# Patient Record
Sex: Female | Born: 2000 | Hispanic: Refuse to answer | Marital: Single | Smoking: Never smoker
Health system: Southern US, Community
[De-identification: ages and names within clinical notes are randomized; demographics above are authoritative.]

## PROBLEM LIST (undated history)

## (undated) HISTORY — PX: NO PAST SURGERIES: SHX2092

---
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
## 2021-11-04 DIAGNOSIS — Z3480 Encounter for supervision of other normal pregnancy, unspecified trimester: Secondary | ICD-10-CM | POA: Insufficient documentation

## 2021-11-04 LAB — HEPATITIS C ANTIBODY: HCV Ab: NEGATIVE

## 2021-11-04 LAB — OB RESULTS CONSOLE HIV ANTIBODY (ROUTINE TESTING): HIV: NONREACTIVE

## 2021-11-04 LAB — OB RESULTS CONSOLE RUBELLA ANTIBODY, IGM: Rubella: IMMUNE

## 2021-11-04 LAB — OB RESULTS CONSOLE HEPATITIS B SURFACE ANTIGEN: Hepatitis B Surface Ag: NEGATIVE

## 2021-11-04 LAB — OB RESULTS CONSOLE RPR: RPR: NONREACTIVE

## 2022-05-23 ENCOUNTER — Encounter (HOSPITAL_COMMUNITY): Payer: Self-pay

## 2022-05-23 ENCOUNTER — Other Ambulatory Visit: Payer: Self-pay

## 2022-05-23 ENCOUNTER — Inpatient Hospital Stay (HOSPITAL_COMMUNITY)
Admission: RE | Admit: 2022-05-23 | Discharge: 2022-05-23 | Disposition: A | Discharge status: Home / Self Care | Payer: Medicaid Other | Attending: Obstetrics and Gynecology | Admitting: Obstetrics and Gynecology

## 2022-05-23 DIAGNOSIS — O4703 False labor before 37 completed weeks of gestation, third trimester: Secondary | ICD-10-CM | POA: Diagnosis not present

## 2022-05-23 DIAGNOSIS — Z3A36 36 weeks gestation of pregnancy: Secondary | ICD-10-CM

## 2022-05-23 DIAGNOSIS — Z0371 Encounter for suspected problem with amniotic cavity and membrane ruled out: Secondary | ICD-10-CM | POA: Diagnosis present

## 2022-05-23 LAB — POCT FERN TEST
POCT Fern Test: NEGATIVE
POCT Fern Test: NEGATIVE

## 2022-05-23 NOTE — MAU Note (Signed)
Cathy Weeks is a 21 y.o. at [redacted]w[redacted]d here in MAU reporting: contractions started around 0400 and they are about every 10 minutes. No bleeding. LOF since 0400, fluid was milky and now is clear, states it is a lot. +FM.  Onset of complaint: today  Pain score: 2/10  Vitals:   05/23/22 0730  BP: 121/76  Pulse: 69  Resp: 16  Temp: 97.7 F (36.5 C)  SpO2: 98%     FHT:137  Lab orders placed from triage: none

## 2022-05-23 NOTE — MAU Provider Note (Signed)
History     [id number]  Arrival date and time: 05/23/22 9485    Chief Complaint  Patient presents with   Contractions   Rupture of Membranes     HPI Cathy Weeks is a 21 y.o. at 50w3dwho presents for vaginal discharge. Reports watery discharge  since 4 am. Discharge is clear & watery with no odor. Has some contractions about every 10 minutes. Denies vaginal symptoms or vaginal bleeding. Reports positive fetal movement.   OB History     Gravida  2   Para  1   Term  1   Preterm      AB      Living  1      SAB      IAB      Ectopic      Multiple      Live Births              Past Medical History:  Diagnosis Date   Postpartum depression 2021   Severe preeclampsia 2021    Past Surgical History:  Procedure Laterality Date   NO PAST SURGERIES      History reviewed. No pertinent family history.  Not on File  No current facility-administered medications on file prior to encounter.   Current Outpatient Medications on File Prior to Encounter  Medication Sig Dispense Refill   Accu-Chek FastClix Lancets MISC Use to monitor blood glucose 4 time(s) daily     Blood Glucose Monitoring Suppl (ACCU-CHEK GUIDE) w/Device KIT by Does not apply route.     glucose blood (ACCU-CHEK GUIDE) test strip Use to monitor blood glucose 4 time(s) daily     Prenatal Vit-Fe Fumarate-FA (PRENATAL VITAMIN) 27-0.8 MG TABS Take 1 tablet by mouth daily.     Aspirin (VAZALORE) 81 MG CAPS Take by mouth.       ROS Pertinent positives and negative per HPI, all others reviewed and negative  Physical Exam   BP 122/75 (BP Location: Right Arm)   Pulse 66   Temp 97.7 F (36.5 C) (Oral)   Resp 16   Ht 5' 2"  (1.575 m)   Wt 58 kg   SpO2 98% Comment: room air  BMI 23.37 kg/m   Patient Vitals for the past 24 hrs:  BP Temp Temp src Pulse Resp SpO2 Height Weight  05/23/22 0756 122/75 -- -- 66 -- -- -- --  05/23/22 0730 121/76 97.7 F (36.5 C) Oral 69 16 98 % -- --   05/23/22 0725 -- -- -- -- -- -- 5' 2"  (1.575 m) 58 kg    Physical Exam Vitals and nursing note reviewed. Exam conducted with a chaperone present.  Constitutional:      General: She is not in acute distress.    Appearance: Normal appearance.  HENT:     Head: Normocephalic and atraumatic.  Eyes:     General: No scleral icterus.    Conjunctiva/sclera: Conjunctivae normal.  Pulmonary:     Effort: Pulmonary effort is normal. No respiratory distress.  Genitourinary:    Comments: Pelvic: NEFG, scant thin white discharge. No pooling of fluid. No vaginal bleeding.  Neurological:     Mental Status: She is alert.  Psychiatric:        Mood and Affect: Mood normal.        Behavior: Behavior normal.      FHT Baseline 130, moderate variability, 15x15 accels, no decels Toco: irregular Cat: 1  Labs Results for orders placed or performed during the  hospital encounter of 05/23/22 (from the past 24 hour(s))  Fern Test     Status: None   Collection Time: 05/23/22  8:00 AM  Result Value Ref Range   POCT Fern Test Negative = intact amniotic membranes   POCT fern test     Status: Normal   Collection Time: 05/23/22  8:46 AM  Result Value Ref Range   POCT Fern Test Negative = intact amniotic membranes     Imaging No results found.  MAU Course  Procedures Lab Orders         Fern Test         POCT fern test    No orders of the defined types were placed in this encounter.  Imaging Orders  No imaging studies ordered today    MDM Sterile speculum exam performed & fern collected. No pooling of fluid. Fern slide evaluated & is negative.   Reviewed prenatal records with [place] through care everywhere. Reviewed visits from 11/04/21, 04/24/2022, & 05/07/2022. Patient declined gtt. Plans on transferring to [facility] for care.  Assessment and Plan   1. Encounter for suspected PROM, with rupture of membranes not found   2. [redacted] weeks gestation of pregnancy    -Reviewed labor precautions &  reasons to return to [place], NP 05/23/22 8:52 AM

## 2022-05-27 ENCOUNTER — Encounter: Payer: Self-pay | Admitting: Certified Nurse Midwife

## 2022-05-29 ENCOUNTER — Ambulatory Visit (INDEPENDENT_AMBULATORY_CARE_PROVIDER_SITE_OTHER): Payer: Medicaid Other | Admitting: Certified Nurse Midwife

## 2022-05-29 ENCOUNTER — Other Ambulatory Visit (HOSPITAL_COMMUNITY)
Admission: RE | Admit: 2022-05-29 | Discharge: 2022-05-29 | Disposition: A | Discharge status: Home / Self Care | Payer: Medicaid Other | Source: Ambulatory Visit | Attending: Certified Nurse Midwife | Admitting: Certified Nurse Midwife

## 2022-05-29 VITALS — BP 123/78 | HR 65 | Wt 134.0 lb

## 2022-05-29 DIAGNOSIS — Z8659 Personal history of other mental and behavioral disorders: Secondary | ICD-10-CM

## 2022-05-29 DIAGNOSIS — Z3A37 37 weeks gestation of pregnancy: Secondary | ICD-10-CM

## 2022-05-29 DIAGNOSIS — Z8759 Personal history of other complications of pregnancy, childbirth and the puerperium: Secondary | ICD-10-CM

## 2022-05-29 DIAGNOSIS — Z3480 Encounter for supervision of other normal pregnancy, unspecified trimester: Secondary | ICD-10-CM

## 2022-05-29 DIAGNOSIS — Z3483 Encounter for supervision of other normal pregnancy, third trimester: Secondary | ICD-10-CM | POA: Diagnosis not present

## 2022-05-29 LAB — OB RESULTS CONSOLE GBS: GBS: POSITIVE

## 2022-05-29 NOTE — Progress Notes (Signed)
Subjective:   Cathy Weeks is a 21 y.o. G2P1001 at 38w2dby early ultrasound being seen today for her first obstetrical visit.  Her obstetrical history is significant for pre-eclampsia and PPD . Patient does intend to breast feed. Pregnancy history fully reviewed.  Patient reports no complaints.  HISTORY: OB History  Gravida Para Term Preterm AB Living  _0 0 0 1  SAB IAB Ectopic Multiple Live Births  0 0 0 0 0    # Outcome Date GA Lbr Len/2nd Weight Sex Delivery Anes PTL Lv  2 Current           1 Term 2021 414w0d F Vag-Spont        Complications: Severe preeclampsia   Past Medical History:  Diagnosis Date   Postpartum depression 2021   Severe preeclampsia 2021   Past Surgical History:  Procedure Laterality Date   NO PAST SURGERIES     History reviewed. No pertinent family history. Social History   Tobacco Use   Smoking status: Never   Smokeless tobacco: Never  Vaping Use   Vaping Use: Never used  Substance Use Topics   Alcohol use: Never   Drug use: Not Currently    Types: Marijuana   Not on File Current Outpatient Medications on File Prior to Visit  Medication Sig Dispense Refill   Accu-Chek FastClix Lancets MISC Use to monitor blood glucose 4 time(s) daily     Aspirin (VAZALORE) 81 MG CAPS Take by mouth.     Blood Glucose Monitoring Suppl (ACCU-CHEK GUIDE) w/Device KIT by Does not apply route.     glucose blood (ACCU-CHEK GUIDE) test strip Use to monitor blood glucose 4 time(s) daily     Prenatal Vit-Fe Fumarate-FA (PRENATAL VITAMIN) 27-0.8 MG TABS Take 1 tablet by mouth daily.     No current facility-administered medications on file prior to visit.   Indications for ASA therapy (per uptodate) One of the following: Previous pregnancy with preeclampsia, especially early onset and with an adverse outcome Yes Multifetal gestation No Chronic hypertension No Type 1 or 2 diabetes mellitus No Chronic kidney disease No Autoimmune disease  (antiphospholipid syndrome, systemic lupus erythematosus) No  Two or more of the following: Nulliparity No Obesity (body mass index >30 kg/m2) No Family history of preeclampsia in mother or sister No Age ?35 years No Sociodemographic characteristics (African American race, low socioeconomic level) Yes Personal risk factors (eg, previous pregnancy with low birth weight or small for gestational age infant, previous adverse pregnancy outcome [eg, stillbirth], interval >10 years between pregnancies) No  Indications for early 1 hour GTT (per uptodate)  BMI >25 (>23 in Asian women) AND one of the following  Gestational diabetes mellitus in a previous pregnancy No Glycated hemoglobin ?5.7 percent (39 mmol/mol), impaired glucose tolerance, or impaired fasting glucose on previous testing No First-degree relative with diabetes No High-risk race/ethnicity (eg, African American, Latino, Native American, [place], Pacific Islander) Yes History of cardiovascular disease No Hypertension or on therapy for hypertension No High-density lipoprotein cholesterol level <35 mg/dL (0.90 mmol/L) and/or a triglyceride level >250 mg/dL (2.82 mmol/L) No Polycystic ovary syndrome No Physical inactivity No Other clinical condition associated with insulin resistance (eg, severe obesity, acanthosis nigricans) No Previous birth of an infant weighing ?4000 g No Previous stillbirth of unknown cause No   Exam   Vitals:   05/29/22 0936  BP: 123/78  Pulse: 65  Weight: 134 lb (60.8 kg)   Fetal Heart Rate (  bpm): 138  Uterus:     Pelvic Exam: Perineum: deferred   Vulva:    Vagina:     Cervix:    Adnexa:    Bony Pelvis:   System: General: well-developed, well-nourished female in no acute distress   Breast:     Skin: normal coloration and turgor, no rashes   Neurologic: oriented, normal, negative, normal mood   Extremities: normal strength, tone, and muscle mass, ROM of all joints is normal   HEENT  extraocular movement intact and sclera clear, anicteric   Mouth/Teeth mucous membranes moist, pharynx normal without lesions and dental hygiene good   Neck    Cardiovascular: regular rate    Respiratory:  no respiratory distress   Abdomen: soft, non-tender; no masses     Assessment:   Pregnancy: G2P1001 Patient Active Problem List   Diagnosis Date Noted   Supervision of other normal pregnancy, antepartum 05/29/2022   History of severe pre-eclampsia 05/29/2022   History of postpartum depression 05/29/2022   Prenatal care, subsequent pregnancy, unspecified trimester 11/04/2021     Plan:  1. Prenatal care, subsequent pregnancy, unspecified trimester - Culture, beta strep (group b only) - Cervicovaginal ancillary only( [place])  2. Supervision of other normal pregnancy, antepartum  3. History of severe pre-eclampsia - declines bASA - baseline labs normal - normotensive  4. History of postpartum depression - recommend 1-2 week pp mood check  5. [redacted] weeks gestation of pregnancy - discussed birth preferences - she was informed we do not allow [place] birth d/t infection risk; delayed cord clamping and keeping her placenta were most important to her and we can honor these; she will need to bring a cooler with her - she plans to decline meds and vaccines for baby, informed she can discuss with Peds  Initial labs drawn. Continue prenatal vitamins. Genetic Screening-declined  Ultrasound-normal Problem list reviewed and updated The nature of [place] for [employer] with multiple MDs and other Advanced Practice Providers was explained to patient; also emphasized that fellows, residents, and students are part of our team. Routine obstetric precautions reviewed Return in about 1 week (around 06/05/2022).   Julianne Handler, CNM 11:57 AM 05/29/22

## 2022-05-31 ENCOUNTER — Encounter: Payer: Self-pay | Admitting: Certified Nurse Midwife

## 2022-05-31 LAB — CULTURE, BETA STREP (GROUP B ONLY)
MICRO NUMBER:: 13506413
SPECIMEN QUALITY:: ADEQUATE

## 2022-06-01 ENCOUNTER — Encounter: Payer: Self-pay | Admitting: Certified Nurse Midwife

## 2022-06-01 DIAGNOSIS — O99820 Streptococcus B carrier state complicating pregnancy: Secondary | ICD-10-CM | POA: Insufficient documentation

## 2022-06-01 LAB — CERVICOVAGINAL ANCILLARY ONLY
Chlamydia: NEGATIVE
Comment: NEGATIVE
Comment: NORMAL
Neisseria Gonorrhea: NEGATIVE

## 2022-06-04 ENCOUNTER — Ambulatory Visit (INDEPENDENT_AMBULATORY_CARE_PROVIDER_SITE_OTHER): Payer: Medicaid Other

## 2022-06-04 VITALS — BP 128/70 | HR 66 | Wt 132.0 lb

## 2022-06-04 DIAGNOSIS — Z3480 Encounter for supervision of other normal pregnancy, unspecified trimester: Secondary | ICD-10-CM

## 2022-06-04 DIAGNOSIS — Z8759 Personal history of other complications of pregnancy, childbirth and the puerperium: Secondary | ICD-10-CM

## 2022-06-04 DIAGNOSIS — Z3A38 38 weeks gestation of pregnancy: Secondary | ICD-10-CM | POA: Diagnosis not present

## 2022-06-04 NOTE — Patient Instructions (Signed)

## 2022-06-04 NOTE — Progress Notes (Signed)
   PRENATAL VISIT NOTE  Subjective:  Cathy Weeks is a 21 y.o. G2P1001 at [redacted]w[redacted]d being seen today for ongoing prenatal care.  She is currently monitored for the following issues for this low-risk pregnancy and has Prenatal care, subsequent pregnancy, unspecified trimester; Supervision of other normal pregnancy, antepartum; History of severe pre-eclampsia; History of postpartum depression; and GBS (group B Streptococcus carrier), +RV culture, currently pregnant on their problem list.  Patient reports no complaints.  Contractions: Irritability. Vag. Bleeding: None.  Movement: Present. Denies leaking of fluid.   The following portions of the patient's history were reviewed and updated as appropriate: allergies, current medications, past family history, past medical history, past social history, past surgical history and problem list.   Objective:   Vitals:   06/04/22 0937  BP: 128/70  Pulse: 66  Weight: 132 lb (59.9 kg)    Fetal Status: Fetal Heart Rate (bpm): 130 Fundal Height: 38 cm Movement: Present     General:  Alert, oriented and cooperative. Patient is in no acute distress.  Skin: Skin is warm and dry. No rash noted.   Cardiovascular: Normal heart rate noted  Respiratory: Normal respiratory effort, no problems with respiration noted  Abdomen: Soft, gravid, appropriate for gestational age.  Pain/Pressure: Absent     Pelvic: Cervical exam deferred        Extremities: Normal range of motion.  Edema: None  Mental Status: Normal mood and affect. Normal behavior. Normal judgment and thought content.   Assessment and Plan:  Pregnancy: G2P1001 at [redacted]w[redacted]d 1. Supervision of other normal pregnancy, antepartum -Doing well without concern.  -Anticipatory guidance of next visits reviewed  2. History of severe pre-eclampsia -BP stable  3. [redacted] weeks gestation of pregnancy   Preterm labor symptoms and general obstetric precautions including but not limited to vaginal bleeding,  contractions, leaking of fluid and fetal movement were reviewed in detail with the patient. Please refer to After Visit Summary for other counseling recommendations.   Return in about 1 week (around 06/11/2022) for Return OB visit.  Rolm Bookbinder, CNM

## 2022-06-09 ENCOUNTER — Ambulatory Visit (INDEPENDENT_AMBULATORY_CARE_PROVIDER_SITE_OTHER): Payer: Medicaid Other

## 2022-06-09 VITALS — BP 125/75 | HR 89 | Wt 132.0 lb

## 2022-06-09 DIAGNOSIS — Z3480 Encounter for supervision of other normal pregnancy, unspecified trimester: Secondary | ICD-10-CM

## 2022-06-09 DIAGNOSIS — A491 Streptococcal infection, unspecified site: Secondary | ICD-10-CM

## 2022-06-09 DIAGNOSIS — Z3A38 38 weeks gestation of pregnancy: Secondary | ICD-10-CM | POA: Diagnosis not present

## 2022-06-09 DIAGNOSIS — Z8759 Personal history of other complications of pregnancy, childbirth and the puerperium: Secondary | ICD-10-CM | POA: Diagnosis not present

## 2022-06-09 NOTE — Progress Notes (Signed)
ROB 38.[redacted] wks GA No unusual complaints Requests SVE

## 2022-06-09 NOTE — Progress Notes (Signed)
   PRENATAL VISIT NOTE  Subjective:  Cathy Weeks is a 21 y.o. G2P1001 at [redacted]w[redacted]d being seen today for ongoing prenatal care.  She is currently monitored for the following issues for this low-risk pregnancy and has Prenatal care, subsequent pregnancy, unspecified trimester; Supervision of other normal pregnancy, antepartum; History of severe pre-eclampsia; History of postpartum depression; and GBS (group B Streptococcus carrier), +RV culture, currently pregnant on their problem list.  Patient reports no complaints.  Contractions: Irritability. Vag. Bleeding: None.  Movement: Present. Denies leaking of fluid.   The following portions of the patient's history were reviewed and updated as appropriate: allergies, current medications, past family history, past medical history, past social history, past surgical history and problem list.   Objective:   Vitals:   06/09/22 1122  BP: 125/75  Pulse: 89  Weight: 132 lb (59.9 kg)    Fetal Status: Fetal Heart Rate (bpm): 135 (Simultaneous filing. User may not have seen previous data.) Fundal Height: 38 cm Movement: Present  Presentation: Vertex  General:  Alert, oriented and cooperative. Patient is in no acute distress.  Skin: Skin is warm and dry. No rash noted.   Cardiovascular: Normal heart rate noted  Respiratory: Normal respiratory effort, no problems with respiration noted  Abdomen: Soft, gravid, appropriate for gestational age.  Pain/Pressure: Absent     Pelvic: Cervical exam performed in the presence of a chaperone Dilation: 1 Effacement (%): Thick Station: Ballotable  Extremities: Normal range of motion.  Edema: None  Mental Status: Normal mood and affect. Normal behavior. Normal judgment and thought content.   Assessment and Plan:  Pregnancy: G2P1001 at [redacted]w[redacted]d 1. Supervision of other normal pregnancy, antepartum - Routine OB. Doing well, no concerns - Anticipatory guidance for upcoming appointments - Prefers to go into labor.  Discussed 41 week IOL.  - May start EPO, RRT. Can also do [employer]  2. [redacted] weeks gestation of pregnancy - Endorses active fetal movement - FH appropraite  3. History of severe pre-eclampsia - BP normotensive  4. GBS (group B streptococcus) infection - Treat in labor  Term labor symptoms and general obstetric precautions including but not limited to vaginal bleeding, contractions, leaking of fluid and fetal movement were reviewed in detail with the patient. Please refer to After Visit Summary for other counseling recommendations.   Return in about 1 week (around 06/16/2022) for ROB.  No future appointments.  Brand Males, CNM

## 2022-06-14 ENCOUNTER — Encounter (HOSPITAL_COMMUNITY): Payer: Self-pay | Admitting: Obstetrics & Gynecology

## 2022-06-14 ENCOUNTER — Encounter (HOSPITAL_COMMUNITY): Payer: Self-pay | Admitting: Anesthesiology

## 2022-06-14 ENCOUNTER — Inpatient Hospital Stay (HOSPITAL_COMMUNITY)
Admission: AD | Admit: 2022-06-14 | Discharge: 2022-06-15 | DRG: 807 | Disposition: A | Discharge status: Home / Self Care | Payer: Medicaid Other | Attending: Obstetrics & Gynecology | Admitting: Obstetrics & Gynecology

## 2022-06-14 DIAGNOSIS — O99824 Streptococcus B carrier state complicating childbirth: Secondary | ICD-10-CM | POA: Diagnosis present

## 2022-06-14 DIAGNOSIS — Z3A39 39 weeks gestation of pregnancy: Secondary | ICD-10-CM

## 2022-06-14 DIAGNOSIS — O26893 Other specified pregnancy related conditions, third trimester: Secondary | ICD-10-CM | POA: Diagnosis present

## 2022-06-14 LAB — CBC
HCT: 33.9 % — ABNORMAL LOW (ref 36.0–46.0)
Hemoglobin: 11 g/dL — ABNORMAL LOW (ref 12.0–15.0)
MCH: 29.9 pg (ref 26.0–34.0)
MCHC: 32.4 g/dL (ref 30.0–36.0)
MCV: 92.1 fL (ref 80.0–100.0)
Platelets: 154 10*3/uL (ref 150–400)
RBC: 3.68 MIL/uL — ABNORMAL LOW (ref 3.87–5.11)
RDW: 12.9 % (ref 11.5–15.5)
WBC: 11 10*3/uL — ABNORMAL HIGH (ref 4.0–10.5)
nRBC: 0 % (ref 0.0–0.2)

## 2022-06-14 LAB — TYPE AND SCREEN
ABO/RH(D): A POS
Antibody Screen: NEGATIVE

## 2022-06-14 MED ORDER — SODIUM CHLORIDE 0.9% FLUSH: 3.0000 mL | INTRAVENOUS | Status: DC | PRN

## 2022-06-14 MED ORDER — LACTATED RINGERS IV SOLN: 500.0000 mL | Freq: Once | INTRAVENOUS | Status: DC

## 2022-06-14 MED ORDER — EPHEDRINE 5 MG/ML INJ: 10.0000 mg | INTRAVENOUS | Status: DC | PRN

## 2022-06-14 MED ORDER — PHENYLEPHRINE 80 MCG/ML (10ML) SYRINGE FOR IV PUSH (FOR BLOOD PRESSURE SUPPORT)
80.0000 ug | PREFILLED_SYRINGE | INTRAVENOUS | Status: DC | PRN
Start: 2022-06-14 — End: 2022-06-14

## 2022-06-14 MED ORDER — DIBUCAINE (PERIANAL) 1 % EX OINT: 1.0000 | TOPICAL_OINTMENT | CUTANEOUS | Status: DC | PRN

## 2022-06-14 MED ORDER — TRANEXAMIC ACID-NACL 1000-0.7 MG/100ML-% IV SOLN: 1000.0000 mg | INTRAVENOUS | Status: AC

## 2022-06-14 MED ORDER — SIMETHICONE 80 MG PO CHEW: 80.0000 mg | CHEWABLE_TABLET | ORAL | Status: DC | PRN

## 2022-06-14 MED ORDER — ACETAMINOPHEN 325 MG PO TABS: 650.0000 mg | ORAL_TABLET | ORAL | Status: DC | PRN

## 2022-06-14 MED ORDER — ONDANSETRON HCL 4 MG PO TABS: 4.0000 mg | ORAL_TABLET | ORAL | Status: DC | PRN

## 2022-06-14 MED ORDER — PHENYLEPHRINE 80 MCG/ML (10ML) SYRINGE FOR IV PUSH (FOR BLOOD PRESSURE SUPPORT): 80.0000 ug | PREFILLED_SYRINGE | INTRAVENOUS | Status: DC | PRN

## 2022-06-14 MED ORDER — WITCH HAZEL-GLYCERIN EX PADS: 1.0000 | MEDICATED_PAD | CUTANEOUS | Status: DC | PRN

## 2022-06-14 MED ORDER — PRENATAL MULTIVITAMIN CH: 1.0000 | ORAL_TABLET | Freq: Every day | ORAL | Status: DC

## 2022-06-14 MED ORDER — MEASLES, MUMPS & RUBELLA VAC IJ SOLR: 0.5000 mL | Freq: Once | INTRAMUSCULAR | Status: DC

## 2022-06-14 MED ORDER — OXYCODONE-ACETAMINOPHEN 5-325 MG PO TABS: 1.0000 | ORAL_TABLET | ORAL | Status: DC | PRN

## 2022-06-14 MED ORDER — DIPHENHYDRAMINE HCL 25 MG PO CAPS: 25.0000 mg | ORAL_CAPSULE | Freq: Four times a day (QID) | ORAL | Status: DC | PRN

## 2022-06-14 MED ORDER — DIPHENHYDRAMINE HCL 50 MG/ML IJ SOLN: 12.5000 mg | INTRAMUSCULAR | Status: DC | PRN

## 2022-06-14 MED ORDER — TRANEXAMIC ACID-NACL 1000-0.7 MG/100ML-% IV SOLN
INTRAVENOUS | Status: AC
  Administered 2022-06-14: 1000 mg via INTRAVENOUS
  Filled 2022-06-14: qty 100

## 2022-06-14 MED ORDER — OXYTOCIN-SODIUM CHLORIDE 30-0.9 UT/500ML-% IV SOLN
2.5000 [IU]/h | INTRAVENOUS | Status: DC
  Filled 2022-06-14: qty 500

## 2022-06-14 MED ORDER — FENTANYL CITRATE (PF) 100 MCG/2ML IJ SOLN
50.0000 ug | INTRAMUSCULAR | Status: DC | PRN
  Administered 2022-06-14: 100 ug via INTRAVENOUS
  Filled 2022-06-14: qty 2

## 2022-06-14 MED ORDER — OXYTOCIN BOLUS FROM INFUSION: 333.0000 mL | Freq: Once | INTRAVENOUS | Status: DC

## 2022-06-14 MED ORDER — FENTANYL-BUPIVACAINE-NACL 0.5-0.125-0.9 MG/250ML-% EP SOLN
12.0000 mL/h | EPIDURAL | Status: DC | PRN
  Filled 2022-06-14: qty 250

## 2022-06-14 MED ORDER — KETOROLAC TROMETHAMINE 30 MG/ML IJ SOLN
INTRAMUSCULAR | Status: AC
  Filled 2022-06-14: qty 1

## 2022-06-14 MED ORDER — PENICILLIN G POTASSIUM 5000000 UNITS IJ SOLR
5.0000 10*6.[IU] | Freq: Once | INTRAMUSCULAR | Status: AC
  Administered 2022-06-14: 5 10*6.[IU] via INTRAVENOUS
  Filled 2022-06-14: qty 5

## 2022-06-14 MED ORDER — SOD CITRATE-CITRIC ACID 500-334 MG/5ML PO SOLN: 30.0000 mL | ORAL | Status: DC | PRN

## 2022-06-14 MED ORDER — SENNOSIDES-DOCUSATE SODIUM 8.6-50 MG PO TABS: 2.0000 | ORAL_TABLET | ORAL | Status: DC

## 2022-06-14 MED ORDER — SODIUM CHLORIDE 0.9% FLUSH: 3.0000 mL | Freq: Two times a day (BID) | INTRAVENOUS | Status: DC

## 2022-06-14 MED ORDER — IBUPROFEN 600 MG PO TABS
600.0000 mg | ORAL_TABLET | Freq: Four times a day (QID) | ORAL | Status: DC
  Filled 2022-06-14: qty 1

## 2022-06-14 MED ORDER — PENICILLIN G POT IN DEXTROSE 60000 UNIT/ML IV SOLN: 3.0000 10*6.[IU] | INTRAVENOUS | Status: DC

## 2022-06-14 MED ORDER — MEDROXYPROGESTERONE ACETATE 150 MG/ML IM SUSP: 150.0000 mg | INTRAMUSCULAR | Status: DC | PRN

## 2022-06-14 MED ORDER — TETANUS-DIPHTH-ACELL PERTUSSIS 5-2.5-18.5 LF-MCG/0.5 IM SUSY: 0.5000 mL | PREFILLED_SYRINGE | Freq: Once | INTRAMUSCULAR | Status: DC

## 2022-06-14 MED ORDER — KETOROLAC TROMETHAMINE 30 MG/ML IJ SOLN
30.0000 mg | Freq: Once | INTRAMUSCULAR | Status: AC
  Administered 2022-06-14: 30 mg via INTRAVENOUS

## 2022-06-14 MED ORDER — BENZOCAINE-MENTHOL 20-0.5 % EX AERO: 1.0000 | INHALATION_SPRAY | CUTANEOUS | Status: DC | PRN

## 2022-06-14 MED ORDER — ZOLPIDEM TARTRATE 5 MG PO TABS: 5.0000 mg | ORAL_TABLET | Freq: Every evening | ORAL | Status: DC | PRN

## 2022-06-14 MED ORDER — ONDANSETRON HCL 4 MG/2ML IJ SOLN: 4.0000 mg | Freq: Four times a day (QID) | INTRAMUSCULAR | Status: DC | PRN

## 2022-06-14 MED ORDER — LACTATED RINGERS IV SOLN: 500.0000 mL | INTRAVENOUS | Status: DC | PRN

## 2022-06-14 MED ORDER — ONDANSETRON HCL 4 MG/2ML IJ SOLN: 4.0000 mg | INTRAMUSCULAR | Status: DC | PRN

## 2022-06-14 MED ORDER — LIDOCAINE HCL (PF) 1 % IJ SOLN
30.0000 mL | INTRAMUSCULAR | Status: AC | PRN
  Administered 2022-06-14: 30 mL via SUBCUTANEOUS
  Filled 2022-06-14: qty 30

## 2022-06-14 MED ORDER — LACTATED RINGERS IV SOLN: INTRAVENOUS | Status: DC

## 2022-06-14 MED ORDER — OXYCODONE-ACETAMINOPHEN 5-325 MG PO TABS: 2.0000 | ORAL_TABLET | ORAL | Status: DC | PRN

## 2022-06-14 MED ORDER — COCONUT OIL OIL
1.0000 | TOPICAL_OIL | Status: DC | PRN
Start: 2022-06-14 — End: 2022-06-15

## 2022-06-14 MED ORDER — SODIUM CHLORIDE 0.9 % IV SOLN: INTRAVENOUS | Status: DC | PRN

## 2022-06-14 NOTE — Discharge Summary (Addendum)
Postpartum Discharge Summary  Date of Service updated 06/15/22      Patient Name: Cathy Weeks DOB: [birth year] MRN: [id number]  Date of admission: 06/14/2022 Delivery date:06/14/2022  Delivering provider: Gilles Chiquito  Date of discharge: 06/15/2022  Admitting diagnosis: Indication for care in labor and delivery, antepartum [O75.9] Intrauterine pregnancy: [redacted]w[redacted]d     Secondary diagnosis:  Principal Problem:   Indication for care in labor and delivery, antepartum Active Problems:   Shoulder dystocia during labor and delivery  Additional problems: 1 Minute Shoulder Dystocia.     Discharge diagnosis: Term Pregnancy Delivered                                              Post partum procedures: n/a Augmentation: N/A Complications: None  Hospital course: Onset of Labor With Vaginal Delivery      21 y.o. yo [id number] at [redacted]w[redacted]d was admitted in Active Labor on 06/14/2022. Patient had an uncomplicated labor course as follows:  Membrane Rupture Time/Date: 5:51 PM ,06/14/2022   Delivery Method:Vaginal, Spontaneous  Episiotomy:   Lacerations:  1st degree;Perineal  Patient had an uncomplicated postpartum course.  She is ambulating, tolerating a regular diet, passing flatus, and urinating well. Patient is discharged home in stable condition on 06/15/22.  Newborn Data: Birth date:06/14/2022  Birth time:5:56 PM  Gender:Female  Living status:Living  Apgars:8 ,9  Weight:3260 g   Magnesium Sulfate received: No BMZ received: No Rhophylac:N/A MMR:N/A T-DaP: Declined  Flu: N/A Transfusion:No  Physical exam  Vitals:   06/14/22 2102 06/15/22 0021 06/15/22 0501 06/15/22 0910  BP: 125/75 109/60 119/79 116/72  Pulse: 70 79 69 63  Resp: 16  18 18   Temp: 97.8 F (36.6 C)  98 F (36.7 C) 98.1 F (36.7 C)  TempSrc: Oral  Oral Oral  SpO2: 100%  100%    General: alert, cooperative, and no distress Lochia: appropriate Uterine Fundus: firm Incision: N/A DVT Evaluation: No evidence of  DVT seen on physical exam. No cords or calf tenderness. Labs: Lab Results  Component Value Date   WBC 11.0 (H) 06/14/2022   HGB 11.0 (L) 06/14/2022   HCT 33.9 (L) 06/14/2022   MCV 92.1 06/14/2022   PLT 154 06/14/2022       No data to display         Edinburgh Score:     No data to display           After visit meds:  Allergies as of 06/15/2022   No Known Allergies      Medication List     TAKE these medications    ibuprofen 600 MG tablet Commonly known as: ADVIL Take 1 tablet (600 mg total) by mouth every 6 (six) hours.   Slynd 4 MG Tabs Generic drug: Drospirenone Take 1 tablet by mouth daily.         Discharge home in stable condition Infant Feeding: Bottle and Breast Infant Disposition:home with mother Discharge instruction: per After Visit Summary and Postpartum booklet. Activity: Advance as tolerated. Pelvic rest for 6 weeks.  Diet: routine diet Future Appointments: Future Appointments  Date Time Provider Department Center  06/19/2022  9:30 AM [facility] NURSE [facility]  06/19/2022  9:50 AM Rasch, Harolyn Rutherford, NP [facility]   Follow up Visit:   Please schedule this patient for a Virtual postpartum visit in 4 weeks with  the following provider: Any provider. Additional Postpartum F/U: N/a    Low risk pregnancy complicated by:  n/a  Delivery mode:  Vaginal, Spontaneous  Anticipated Birth Control:  pills    06/15/2022 Sharon Seller, DO

## 2022-06-15 ENCOUNTER — Other Ambulatory Visit (HOSPITAL_COMMUNITY): Payer: Self-pay

## 2022-06-15 LAB — RPR: RPR Ser Ql: NONREACTIVE

## 2022-06-15 MED ORDER — IBUPROFEN 600 MG PO TABS
600.0000 mg | ORAL_TABLET | Freq: Four times a day (QID) | ORAL | 0 refills | Status: DC
  Filled 2022-06-15: qty 30, 8d supply, fill #0

## 2022-06-15 MED ORDER — SLYND 4 MG PO TABS: 1.0000 | ORAL_TABLET | Freq: Every day | ORAL | 4 refills | Status: DC
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
## 2022-06-16 ENCOUNTER — Ambulatory Visit: Payer: Self-pay

## 2022-06-17 ENCOUNTER — Inpatient Hospital Stay (HOSPITAL_COMMUNITY): Admit: 2022-06-17 | Payer: Medicaid Other | Admitting: Family Medicine

## 2022-06-19 ENCOUNTER — Ambulatory Visit: Payer: Medicaid Other

## 2022-06-19 ENCOUNTER — Encounter: Payer: Medicaid Other | Admitting: Obstetrics and Gynecology

## 2022-06-22 ENCOUNTER — Telehealth (HOSPITAL_COMMUNITY): Payer: Self-pay | Admitting: *Deleted

## 2022-06-22 NOTE — Telephone Encounter (Signed)
Mom reports feeling good. No concerns about herself at this time. EPDS=3 [facility] score=0) Mom reports baby is doing well. Feeding, peeing, and pooping without difficulty. Safe sleep reviewed. Mom reports no concerns about baby at present.  Duffy Rhody, RN 06-22-2022 at 10:03am

## 2022-07-16 ENCOUNTER — Telehealth: Payer: Medicaid Other | Admitting: Obstetrics and Gynecology

## 2022-07-21 ENCOUNTER — Telehealth: Payer: Self-pay

## 2022-07-21 ENCOUNTER — Telehealth: Payer: Medicaid Other | Admitting: Obstetrics and Gynecology

## 2022-07-21 NOTE — Telephone Encounter (Signed)
Attempted to call pt to begin virtual visit. Pt did not answer. No voicemail box set up.

## 2022-07-21 NOTE — Telephone Encounter (Signed)
Attempted to call patient again to begin virtual visit. Pt did not answer. Pt has no voicemail box set up.

## 2022-08-21 ENCOUNTER — Encounter (HOSPITAL_COMMUNITY): Payer: Self-pay

## 2022-08-21 ENCOUNTER — Emergency Department (HOSPITAL_COMMUNITY)
Admission: EM | Admit: 2022-08-21 | Discharge: 2022-08-21 | Disposition: A | Discharge status: Home / Self Care | Payer: Medicaid Other | Attending: Emergency Medicine | Admitting: Emergency Medicine

## 2022-08-21 ENCOUNTER — Other Ambulatory Visit: Payer: Self-pay

## 2022-08-21 DIAGNOSIS — S0993XA Unspecified injury of face, initial encounter: Secondary | ICD-10-CM | POA: Insufficient documentation

## 2022-08-21 DIAGNOSIS — W01198A Fall on same level from slipping, tripping and stumbling with subsequent striking against other object, initial encounter: Secondary | ICD-10-CM | POA: Insufficient documentation
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
## 2022-08-27 ENCOUNTER — Encounter: Payer: Self-pay | Admitting: Obstetrics and Gynecology

## 2022-08-27 ENCOUNTER — Other Ambulatory Visit: Payer: Self-pay | Admitting: Obstetrics and Gynecology

## 2022-08-27 ENCOUNTER — Ambulatory Visit (INDEPENDENT_AMBULATORY_CARE_PROVIDER_SITE_OTHER): Payer: Medicaid Other | Admitting: Obstetrics and Gynecology

## 2022-08-27 VITALS — BP 99/62 | HR 66 | Ht 62.0 in | Wt 113.0 lb

## 2022-08-27 DIAGNOSIS — Z3009 Encounter for other general counseling and advice on contraception: Secondary | ICD-10-CM

## 2022-08-27 MED ORDER — SLYND 4 MG PO TABS: 1.0000 | ORAL_TABLET | Freq: Every day | ORAL | 3 refills | Status: DC

## 2022-08-27 NOTE — Progress Notes (Signed)
   GYNECOLOGY OFFICE VISIT NOTE  History:   Cathy Weeks is a 21 y.o. [id number] here today for birth control counseling. She is nursing. She delivered on 6/25 - shoulder dystocia that lasted one min and resolved with secondary maneuver. She would like pills so that they won't affect her breastfeeding. .   She denies SUI.  She had one period around 8/14.     Past Medical History:  Diagnosis Date   Postpartum depression 2021   Severe preeclampsia 2021    Past Surgical History:  Procedure Laterality Date   NO PAST SURGERIES      The following portions of the patient's history were reviewed and updated as appropriate: allergies, current medications, past family history, past medical history, past social history, past surgical history and problem list.   Health Maintenance: Not yet due for paps   Review of Systems:  Pertinent items noted in HPI and remainder of comprehensive ROS otherwise negative.  Physical Exam:  BP 99/62   Pulse 66   Ht 5\' 2"  (1.575 m)   Wt 113 lb (51.3 kg)   Breastfeeding Yes   BMI 20.67 kg/m  CONSTITUTIONAL: Well-developed, well-nourished female in no acute distress.  HEENT:  Normocephalic, atraumatic. External right and left ear normal. No scleral icterus.  NECK: Normal range of motion, supple, no masses noted on observation SKIN: No rash noted. Not diaphoretic. No erythema. No pallor. MUSCULOSKELETAL: Normal range of motion. No edema noted. NEUROLOGIC: Alert and oriented to person, place, and time. Normal muscle tone coordination. No cranial nerve deficit noted. PSYCHIATRIC: Normal mood and affect. Normal behavior. Normal judgment and thought content.  CARDIOVASCULAR: Normal heart rate noted RESPIRATORY: Effort and breath sounds normal, no problems with respiration noted ABDOMEN: No masses noted. No other overt distention noted.    PELVIC: Deferred  Labs and Imaging No results found for this or any previous visit (from the past 168 hour(s)). No  results found.  Assessment and Plan:   1. Birth control counseling - Reviewed different types of birth control available: OCPs, vaginal ring, Nexplanon, Depo, various types of IUDs, permanent sterilization.  We reviewed the advantages and risks of each (particularly risk of VTE with estrogen containing options). We discussed side effects of each. - Patient desires: POPs - Drospirenone (SLYND) 4 MG TABS; Take 1 tablet by mouth daily.  Dispense: 84 tablet; Refill: 3  Routine preventative health maintenance measures emphasized. Please refer to After Visit Summary for other counseling recommendations.   No follow-ups on file.  , MD, FACOG Obstetrician & Gynecologist, [facility] for [facility], [facility]

## 2022-09-02 ENCOUNTER — Other Ambulatory Visit: Payer: Self-pay

## 2022-09-02 DIAGNOSIS — Z3009 Encounter for other general counseling and advice on contraception: Secondary | ICD-10-CM

## 2022-09-02 MED ORDER — SLYND 4 MG PO TABS: 1.0000 | ORAL_TABLET | Freq: Every day | ORAL | 3 refills | Status: AC

## 2022-09-02 NOTE — Progress Notes (Signed)
Pt called stating Rx for Slynd isn't coved by insurance. Rx was sent to [facility]. I am resending Slynd Rx to [facility] and they will do PA for medication and send it to pt.

## 2022-09-28 ENCOUNTER — Encounter: Payer: Self-pay | Admitting: Obstetrics and Gynecology

## 2022-09-28 ENCOUNTER — Other Ambulatory Visit: Payer: Self-pay

## 2022-09-28 DIAGNOSIS — Z3009 Encounter for other general counseling and advice on contraception: Secondary | ICD-10-CM

## 2022-09-28 MED ORDER — NORETHINDRONE 0.35 MG PO TABS: 1.0000 | ORAL_TABLET | Freq: Every day | ORAL | 11 refills | Status: AC

## 2022-09-28 NOTE — Progress Notes (Signed)
I spoke with preferred pharmacy and [facility]. Rx for [facility] not covered at either pharmacy. [facility] sent per Dr.Duncan

## 2023-04-24 ENCOUNTER — Ambulatory Visit: Payer: Medicaid Other

## 2023-04-28 ENCOUNTER — Ambulatory Visit: Payer: Medicaid Other

## 2023-08-17 ENCOUNTER — Telehealth: Payer: Self-pay | Admitting: *Deleted

## 2023-08-17 NOTE — Telephone Encounter (Signed)
 Patient does not want to schedule annual at this time.

## 2023-10-22 ENCOUNTER — Other Ambulatory Visit: Payer: Self-pay | Admitting: Obstetrics and Gynecology

## 2023-10-22 DIAGNOSIS — Z3009 Encounter for other general counseling and advice on contraception: Secondary | ICD-10-CM

## 2025-01-22 ENCOUNTER — Emergency Department (HOSPITAL_BASED_OUTPATIENT_CLINIC_OR_DEPARTMENT_OTHER)
Admission: EM | Admit: 2025-01-22 | Discharge: 2025-01-23 | Disposition: A | Payer: Self-pay | Attending: Emergency Medicine | Admitting: Emergency Medicine

## 2025-01-22 ENCOUNTER — Emergency Department (HOSPITAL_BASED_OUTPATIENT_CLINIC_OR_DEPARTMENT_OTHER): Payer: Self-pay

## 2025-01-22 ENCOUNTER — Encounter (HOSPITAL_BASED_OUTPATIENT_CLINIC_OR_DEPARTMENT_OTHER): Payer: Self-pay

## 2025-01-22 ENCOUNTER — Other Ambulatory Visit: Payer: Self-pay

## 2025-01-22 DIAGNOSIS — M542 Cervicalgia: Secondary | ICD-10-CM | POA: Insufficient documentation

## 2025-01-22 DIAGNOSIS — R059 Cough, unspecified: Secondary | ICD-10-CM | POA: Insufficient documentation

## 2025-01-22 DIAGNOSIS — M545 Low back pain, unspecified: Secondary | ICD-10-CM | POA: Insufficient documentation

## 2025-01-22 DIAGNOSIS — R519 Headache, unspecified: Secondary | ICD-10-CM | POA: Insufficient documentation

## 2025-01-22 LAB — RESP PANEL BY RT-PCR (RSV, FLU A&B, COVID)  RVPGX2
Influenza A by PCR: NEGATIVE
Influenza B by PCR: NEGATIVE
Resp Syncytial Virus by PCR: NEGATIVE
SARS Coronavirus 2 by RT PCR: NEGATIVE

## 2025-01-22 MED ORDER — KETOROLAC TROMETHAMINE 15 MG/ML IJ SOLN
15.0000 mg | Freq: Once | INTRAMUSCULAR | Status: AC
  Administered 2025-01-23: 15 mg via INTRAVENOUS
  Filled 2025-01-22: qty 1

## 2025-01-22 MED ORDER — PROCHLORPERAZINE MALEATE 10 MG PO TABS
5.0000 mg | ORAL_TABLET | Freq: Once | ORAL | Status: AC
  Administered 2025-01-23: 5 mg via ORAL
  Filled 2025-01-22: qty 1

## 2025-01-22 MED ORDER — MAGNESIUM SULFATE 2 GM/50ML IV SOLN
2.0000 g | Freq: Once | INTRAVENOUS | Status: AC
  Administered 2025-01-23: 2 g via INTRAVENOUS
  Filled 2025-01-22: qty 50

## 2025-01-22 MED ORDER — DIPHENHYDRAMINE HCL 25 MG PO CAPS
25.0000 mg | ORAL_CAPSULE | Freq: Once | ORAL | Status: AC
  Administered 2025-01-23: 25 mg via ORAL
  Filled 2025-01-22: qty 1

## 2025-01-22 MED ORDER — SODIUM CHLORIDE 0.9 % IV BOLUS
500.0000 mL | Freq: Once | INTRAVENOUS | Status: AC
  Administered 2025-01-23: 500 mL via INTRAVENOUS
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
## 2025-01-23 LAB — URINALYSIS, ROUTINE W REFLEX MICROSCOPIC
Bilirubin Urine: NEGATIVE
Glucose, UA: NEGATIVE mg/dL
Hgb urine dipstick: NEGATIVE
Ketones, ur: NEGATIVE mg/dL
Leukocytes,Ua: NEGATIVE
Nitrite: NEGATIVE
Protein, ur: NEGATIVE mg/dL
Specific Gravity, Urine: 1.02 (ref 1.005–1.030)
pH: 7 (ref 5.0–8.0)

## 2025-01-23 LAB — PREGNANCY, URINE: Preg Test, Ur: NEGATIVE

## 2025-01-23 NOTE — ED Notes (Signed)
